# Patient Record
Sex: Female | Born: 1935 | Race: White | Hispanic: No
Health system: Southern US, Community
[De-identification: ages and names within clinical notes are randomized; demographics above are authoritative.]

---

## 2005-02-02 ENCOUNTER — Emergency Department (HOSPITAL_COMMUNITY): Admission: EM | Admit: 2005-02-02 | Discharge: 2005-02-03 | Payer: Self-pay | Admitting: Family Medicine

## 2006-01-13 ENCOUNTER — Ambulatory Visit (HOSPITAL_COMMUNITY): Admission: RE | Admit: 2006-01-13 | Discharge: 2006-01-13 | Payer: Self-pay | Admitting: Internal Medicine

## 2006-03-26 ENCOUNTER — Ambulatory Visit: Payer: Self-pay | Admitting: Internal Medicine

## 2007-10-05 ENCOUNTER — Ambulatory Visit (HOSPITAL_COMMUNITY): Admission: RE | Admit: 2007-10-05 | Discharge: 2007-10-05 | Payer: Self-pay | Admitting: Internal Medicine

## 2008-10-13 ENCOUNTER — Ambulatory Visit: Payer: Self-pay | Admitting: Internal Medicine

## 2008-10-17 ENCOUNTER — Ambulatory Visit (HOSPITAL_COMMUNITY): Admission: RE | Admit: 2008-10-17 | Discharge: 2008-10-17 | Payer: Self-pay | Admitting: Internal Medicine

## 2008-10-25 ENCOUNTER — Telehealth: Payer: Self-pay | Admitting: Internal Medicine

## 2008-10-27 ENCOUNTER — Encounter: Payer: Self-pay | Admitting: Internal Medicine

## 2008-10-27 ENCOUNTER — Ambulatory Visit: Payer: Self-pay | Admitting: Internal Medicine

## 2008-10-29 ENCOUNTER — Encounter: Payer: Self-pay | Admitting: Internal Medicine

## 2008-10-31 ENCOUNTER — Telehealth: Payer: Self-pay | Admitting: Internal Medicine

## 2009-11-13 ENCOUNTER — Ambulatory Visit (HOSPITAL_COMMUNITY): Admission: RE | Admit: 2009-11-13 | Discharge: 2009-11-13 | Payer: Self-pay | Admitting: Internal Medicine

## 2010-03-01 ENCOUNTER — Emergency Department (HOSPITAL_COMMUNITY): Admission: EM | Admit: 2010-03-01 | Discharge: 2010-03-01 | Payer: Self-pay | Admitting: Emergency Medicine

## 2010-04-12 ENCOUNTER — Encounter: Admission: RE | Admit: 2010-04-12 | Discharge: 2010-04-12 | Payer: Self-pay | Admitting: Internal Medicine

## 2010-05-02 ENCOUNTER — Encounter: Admission: RE | Admit: 2010-05-02 | Discharge: 2010-05-02 | Payer: Self-pay | Admitting: Internal Medicine

## 2010-10-10 ENCOUNTER — Encounter
Admission: RE | Admit: 2010-10-10 | Discharge: 2010-10-10 | Payer: Self-pay | Source: Home / Self Care | Attending: Internal Medicine | Admitting: Internal Medicine

## 2010-10-16 IMAGING — CT CT HEAD W/O CM
2 series · 16 of 30 positions shown, 20 images · non-contrast
Comparison: None.

CLINICAL DATA: Memory concerns.  No injury or history cancer.

CT HEAD WITHOUT CONTRAST
TECHNIQUE: Contiguous axial images were obtained from the base of
the skull through the vertex without contrast.

[Series 2: head w/o · axial · non-contrast · 0.43mm/px · z∈[+10,+139]mm · 13 of 28 slices shown, 17 images]
[im 2/28  brain]
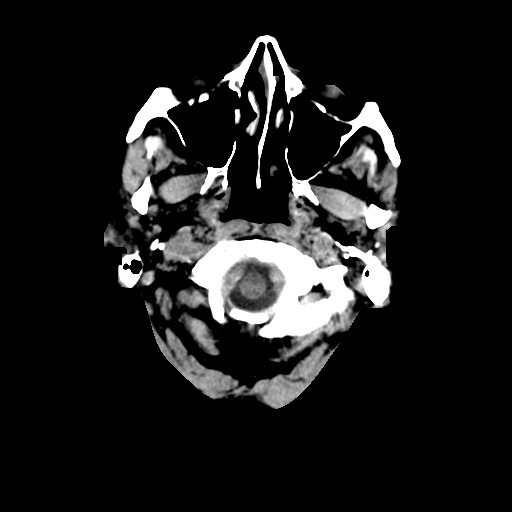
[im 2/28  bone]
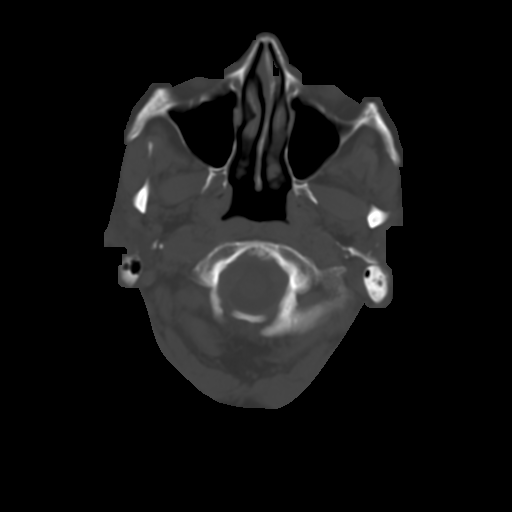
[im 4/28  brain]
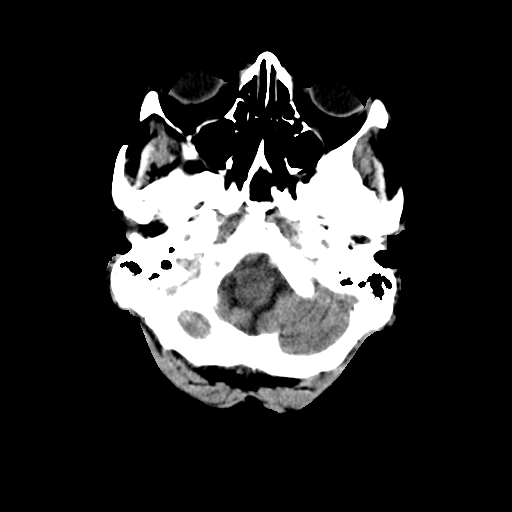
[im 6/28  brain]
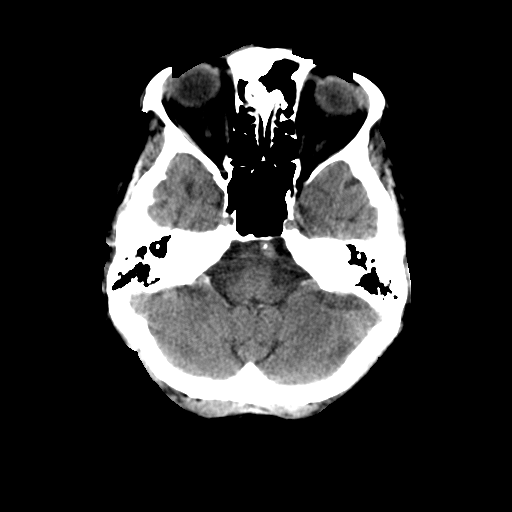
[im 8/28  brain]
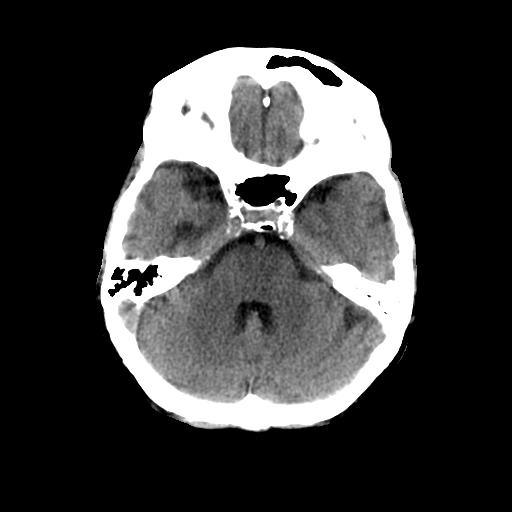
[im 10/28  brain]
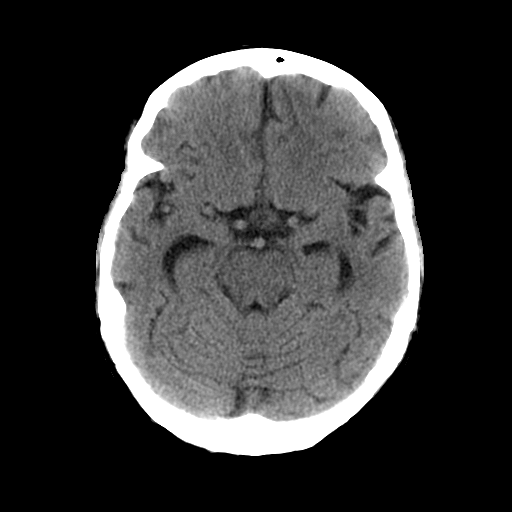
[im 10/28  bone]
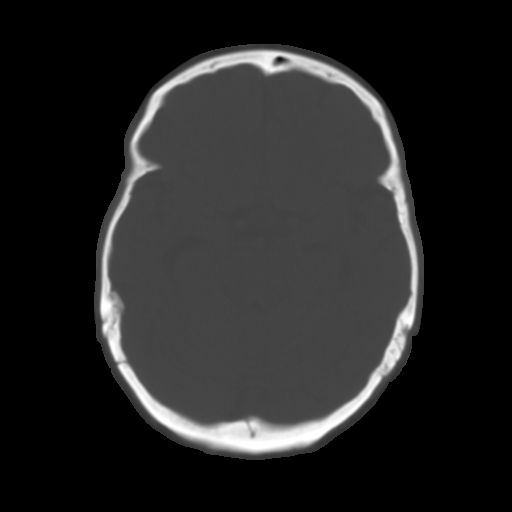
[im 12/28  brain]
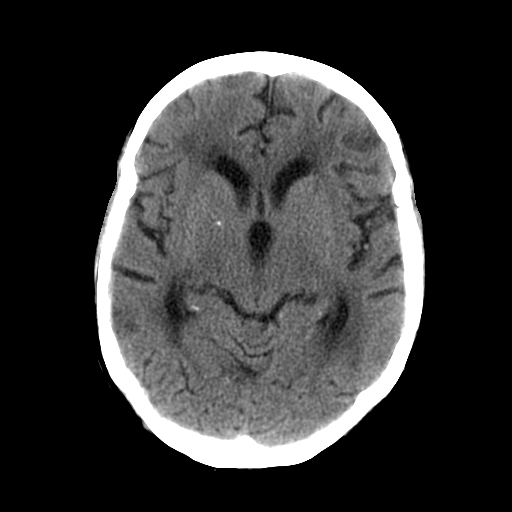
[im 14/28  brain]
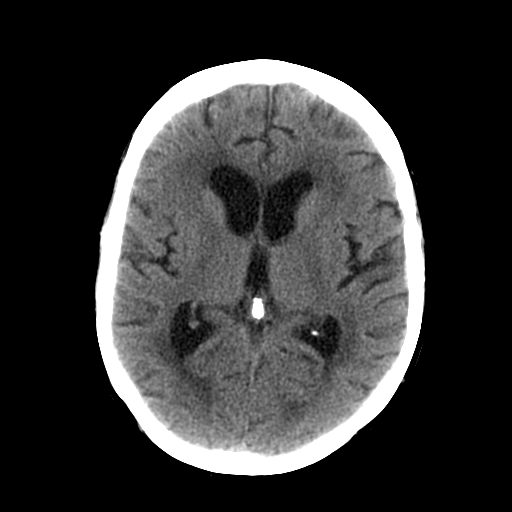
[im 16/28  brain]
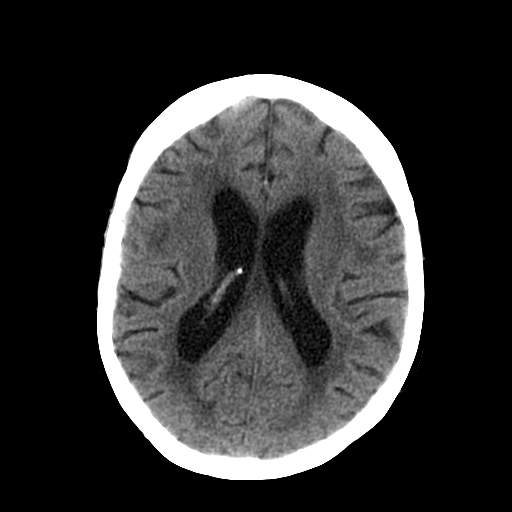
[im 18/28  brain]
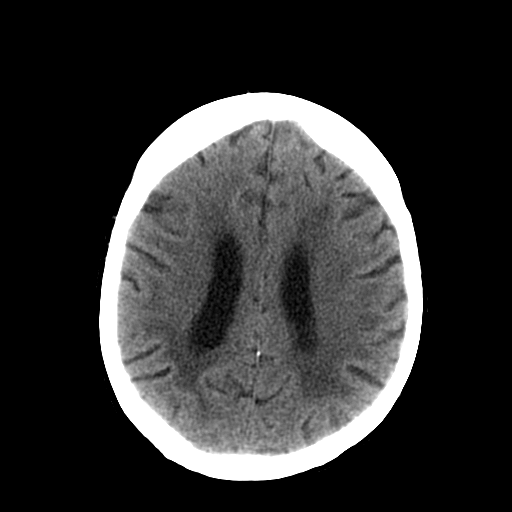
[im 18/28  bone]
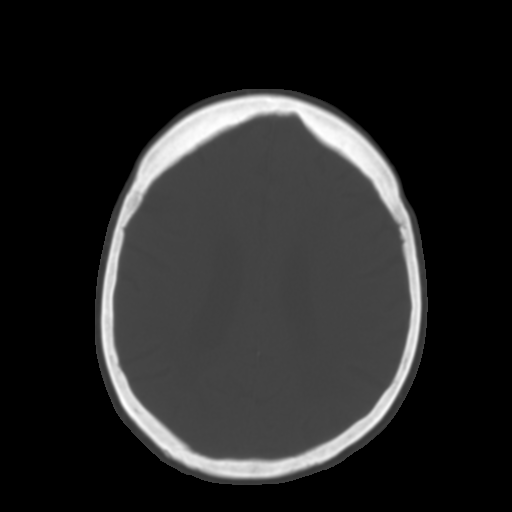
[im 20/28  brain]
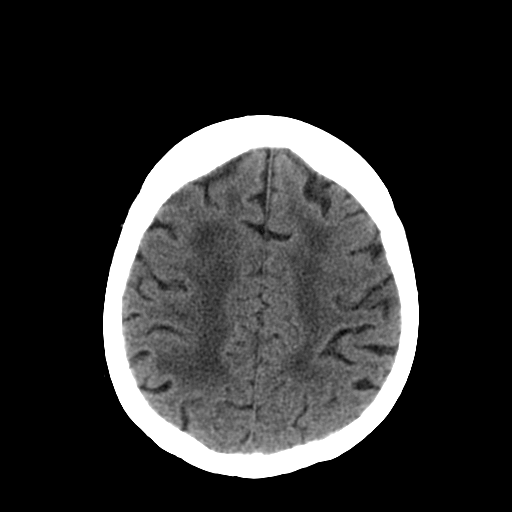
[im 22/28  brain]
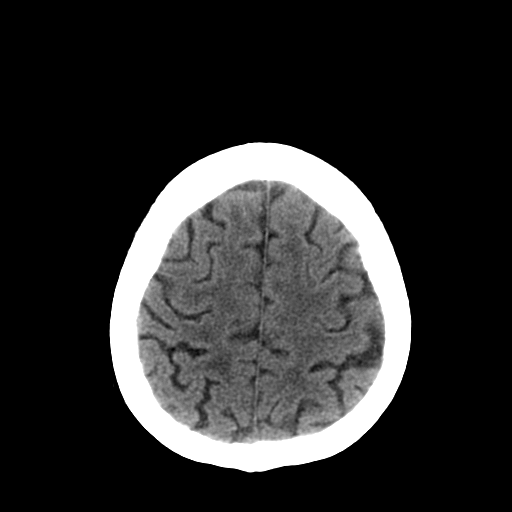
[im 24/28  brain]
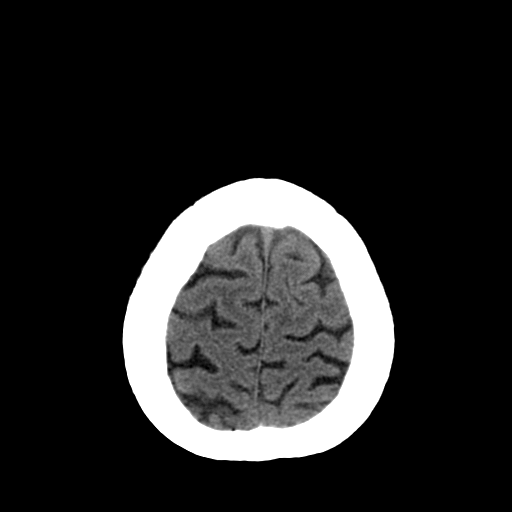
[im 26/28  brain]
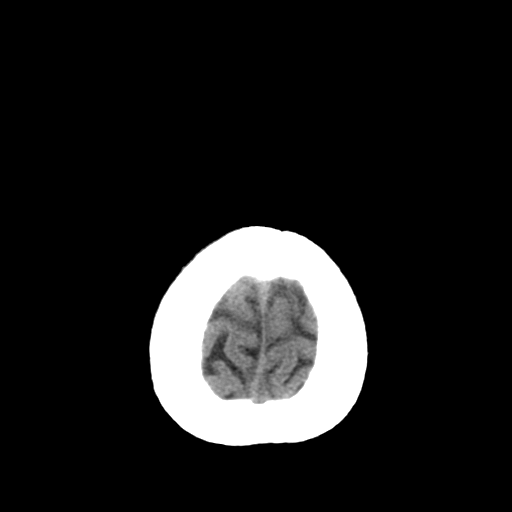
[im 26/28  bone]
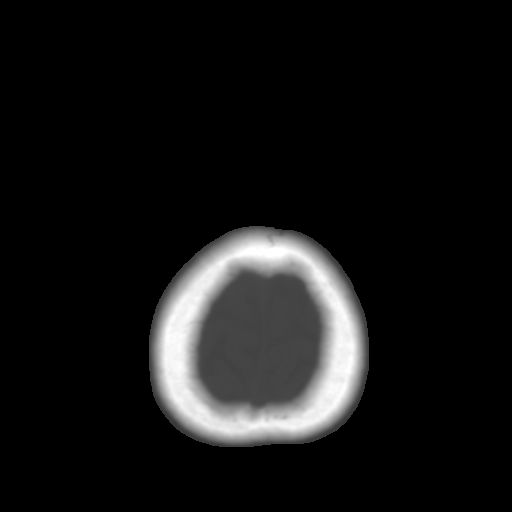

[Series 3: head bone · axial · 0.43mm/px · z∈[+10,+53]mm · 3 of 28 slices shown]
[im 2/28  bone]
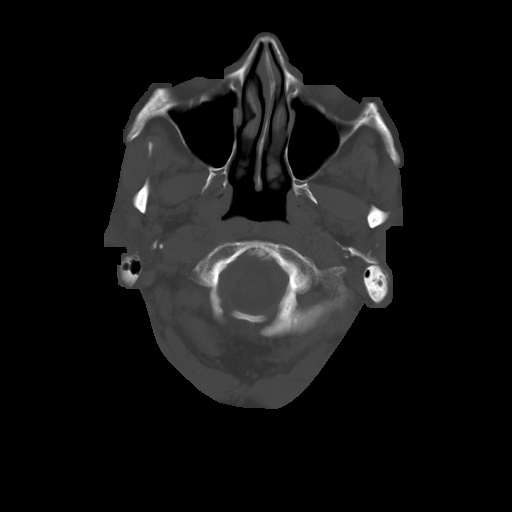
[im 6/28  bone]
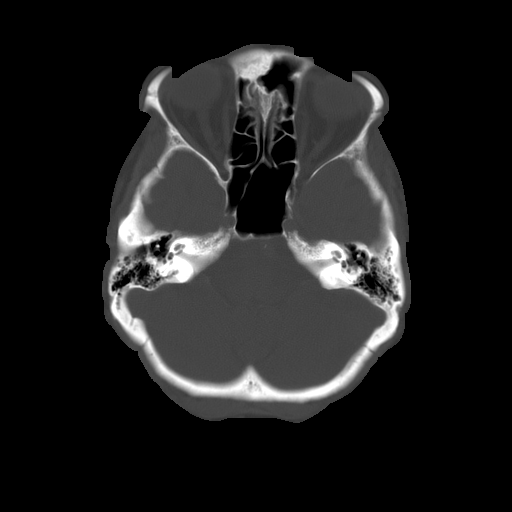
[im 10/28  bone]
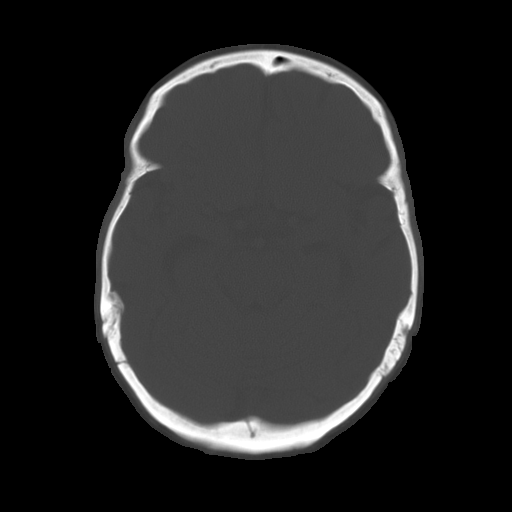

[16 of 30 positions shown; findings below may reference images not displayed]

FINDINGS: No intracranial hemorrhage. No intracranial mass
detected on this unenhanced exam.  Moderate nonspecific white
matter type changes probably related to result of small vessel
disease. No CT evidence of large acute infarct.  Small acute
infarct cannot be excluded by CT.  Intracranial vascular
calcifications.

Mild atrophy global in distribution within the range expected for a
patient of this age.
IMPRESSION: Moderate small vessel disease type changes.

## 2010-11-05 IMAGING — CR DG HIP COMPLETE 2+V*R*
2 series · 2 of 2 positions shown · non-contrast
Comparison: None.

CLINICAL DATA: History of previous injury in [place] from fall.  Hip
pain.

RIGHT HIP - COMPLETE 2+ VIEW

[view not recorded (1 of 2)]
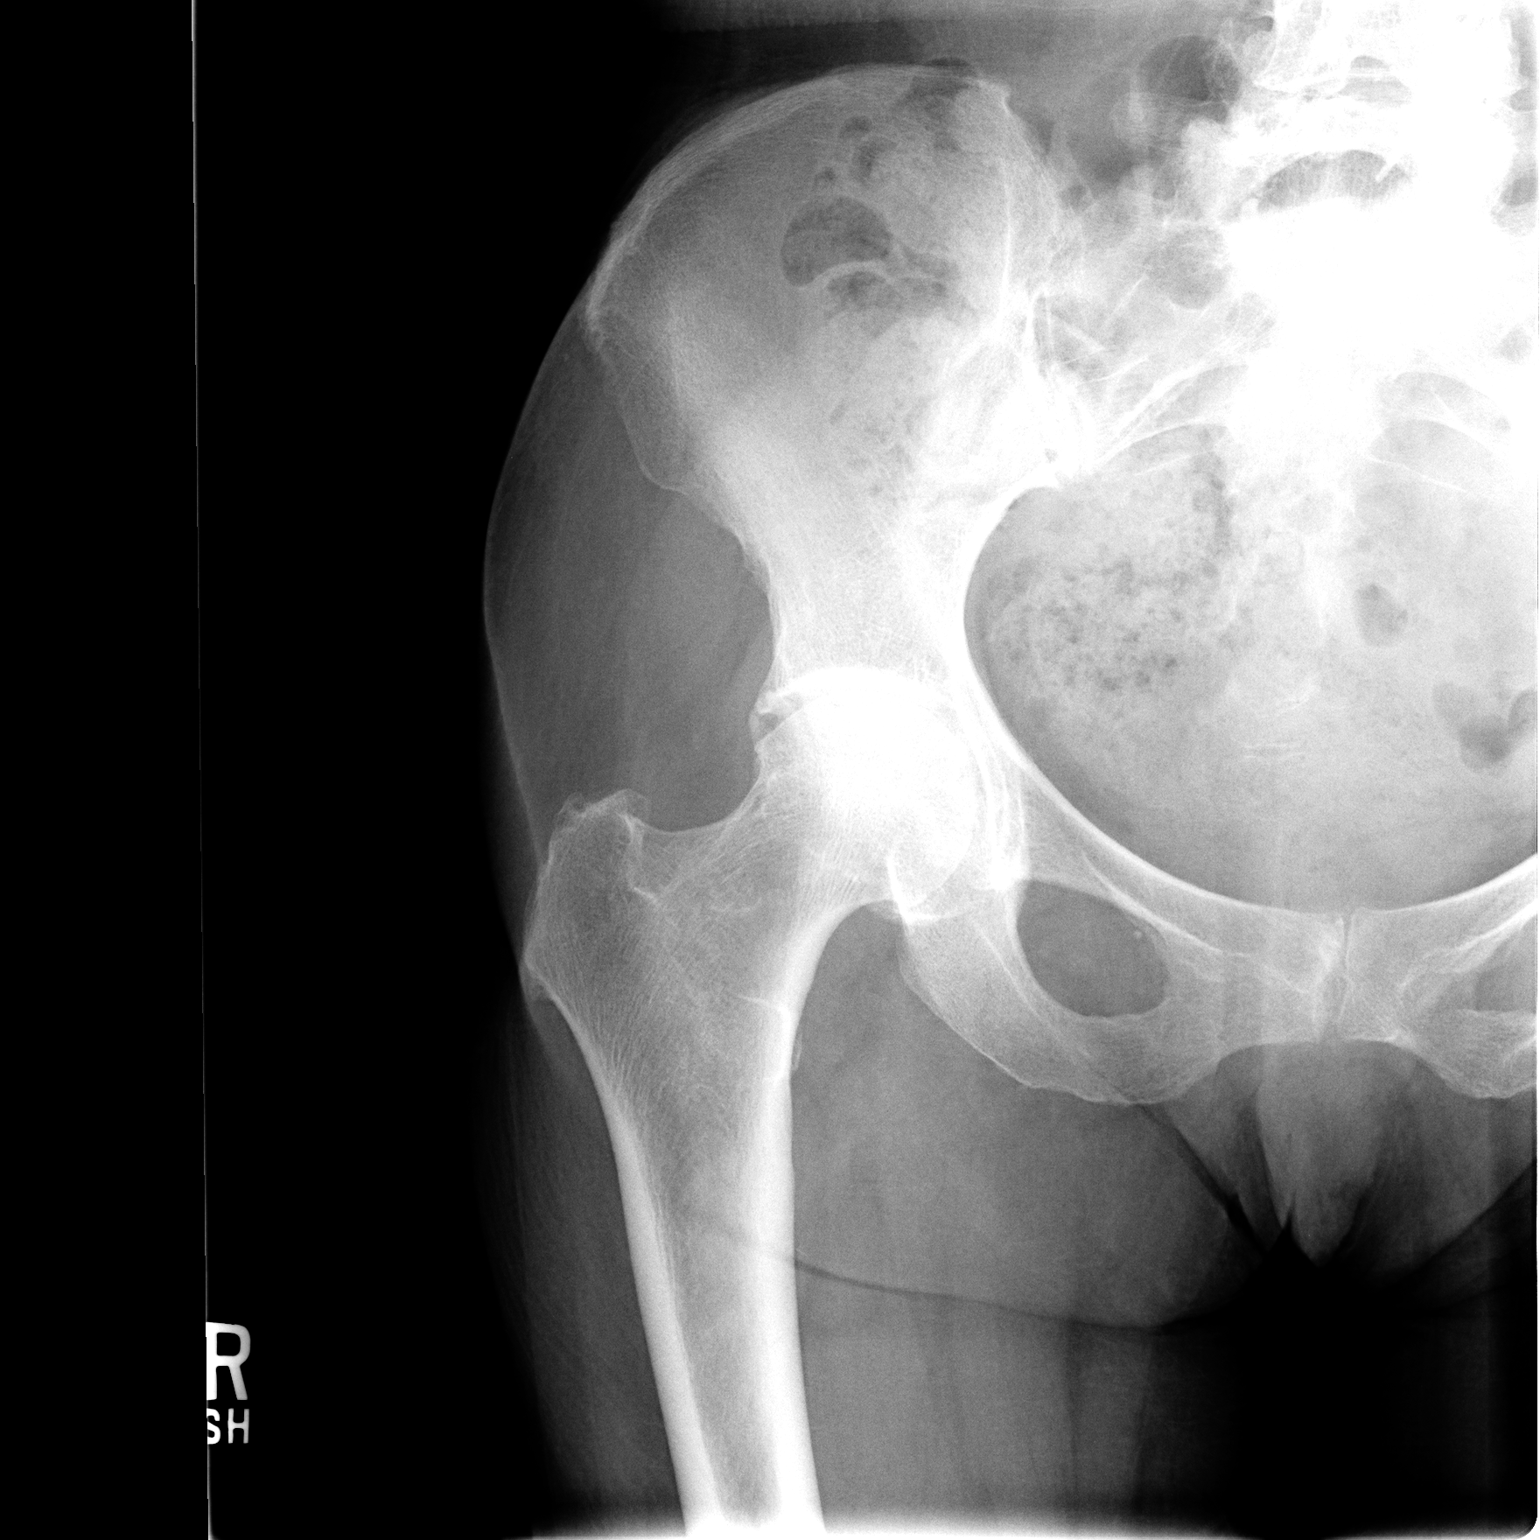

[view not recorded (2 of 2)]
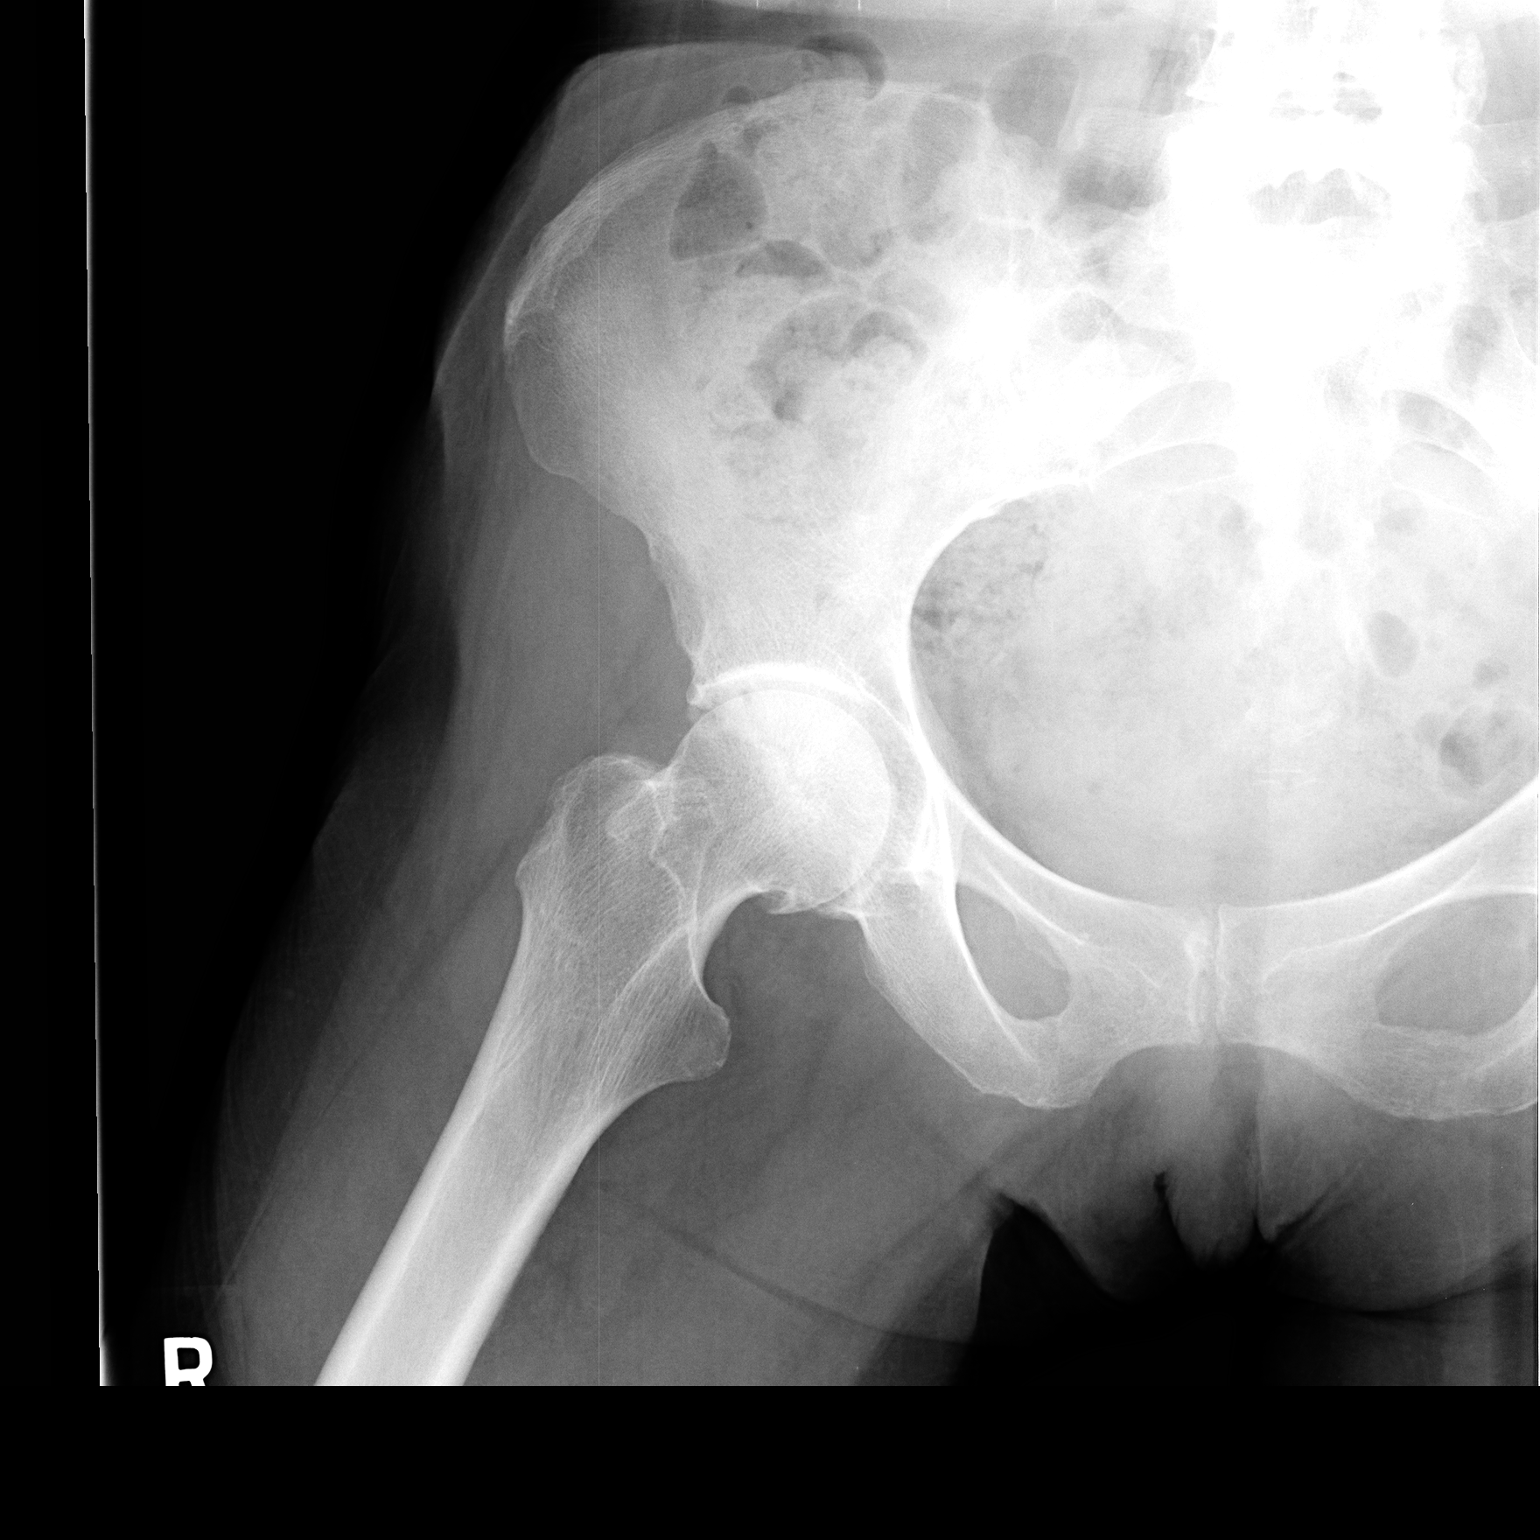

[2 of 2 positions shown; findings below may reference images not displayed]

FINDINGS: There is slight narrowing of the superior joint space.
There is marginal osteophyte formation.  No fracture or bony
destruction is seen.  No calcific bursitis is evident.
IMPRESSION: No fracture or dislocation.  Osteoarthritic changes.

## 2011-03-14 ENCOUNTER — Ambulatory Visit (HOSPITAL_COMMUNITY): Payer: Medicare Other | Attending: Internal Medicine

## 2011-03-14 DIAGNOSIS — M810 Age-related osteoporosis without current pathological fracture: Secondary | ICD-10-CM | POA: Insufficient documentation

## 2012-12-23 ENCOUNTER — Other Ambulatory Visit: Payer: Self-pay | Admitting: *Deleted

## 2012-12-23 ENCOUNTER — Other Ambulatory Visit: Payer: Medicare Other

## 2012-12-23 DIAGNOSIS — N390 Urinary tract infection, site not specified: Secondary | ICD-10-CM

## 2012-12-25 ENCOUNTER — Other Ambulatory Visit: Payer: Self-pay | Admitting: *Deleted

## 2012-12-25 DIAGNOSIS — E039 Hypothyroidism, unspecified: Secondary | ICD-10-CM

## 2012-12-28 ENCOUNTER — Other Ambulatory Visit: Payer: Self-pay | Admitting: Geriatric Medicine

## 2012-12-28 MED ORDER — SULFAMETHOXAZOLE-TRIMETHOPRIM 800-160 MG PO TABS: ORAL_TABLET | ORAL | Status: AC

## 2013-01-12 ENCOUNTER — Encounter: Payer: Self-pay | Admitting: Internal Medicine

## 2013-02-17 ENCOUNTER — Ambulatory Visit: Payer: Self-pay | Admitting: Internal Medicine

## 2013-09-07 ENCOUNTER — Encounter: Payer: Self-pay | Admitting: Internal Medicine

## 2014-01-24 ENCOUNTER — Encounter (HOSPITAL_BASED_OUTPATIENT_CLINIC_OR_DEPARTMENT_OTHER): Payer: Medicare Other | Attending: General Surgery

## 2014-01-24 DIAGNOSIS — G309 Alzheimer's disease, unspecified: Secondary | ICD-10-CM | POA: Insufficient documentation

## 2014-01-24 DIAGNOSIS — H409 Unspecified glaucoma: Secondary | ICD-10-CM | POA: Insufficient documentation

## 2014-01-24 DIAGNOSIS — Z79899 Other long term (current) drug therapy: Secondary | ICD-10-CM | POA: Insufficient documentation

## 2014-01-24 DIAGNOSIS — F0280 Dementia in other diseases classified elsewhere without behavioral disturbance: Secondary | ICD-10-CM | POA: Diagnosis not present

## 2014-01-24 DIAGNOSIS — N189 Chronic kidney disease, unspecified: Secondary | ICD-10-CM | POA: Insufficient documentation

## 2014-01-24 DIAGNOSIS — L98499 Non-pressure chronic ulcer of skin of other sites with unspecified severity: Secondary | ICD-10-CM | POA: Diagnosis not present

## 2014-01-24 DIAGNOSIS — R4701 Aphasia: Secondary | ICD-10-CM | POA: Diagnosis not present

## 2014-01-24 DIAGNOSIS — Z7982 Long term (current) use of aspirin: Secondary | ICD-10-CM | POA: Insufficient documentation

## 2014-01-25 NOTE — Progress Notes (Signed)
[facility]  NAME:  Crystal Kim, Diamonds                   ACCOUNT NO.:  [id number]  MEDICAL RECORD NO.:  [id number]      DATE OF BIRTH:  03-10-1930  PHYSICIAN:  Wayland Denislaire Sanger, DO       VISIT DATE:  01/24/2014                                  OFFICE VISIT   The patient is an 80 year old female who is here for evaluation of a right trochanter ulcer.  She has Alzheimer's, and at this point, showing to be aphasic.  She is accompanied by her 2 daughters who are well aware of her medical condition.  She is in a nursing facility and get home health visits as well.  She is less mobile than she was and it is likely that this is why she got the pressure ulcer.  PAST MEDICAL HISTORY:  Positive for weight loss, glaucoma, cardiac disease, chronic renal disease, vitamin D deficiency, dementia, mitral valve prolapse, and thyroid disease.  FAMILY HISTORY:  At this point is noncontributory.  SOCIAL HISTORY:  Lives in a facility, has home care, 4 children, strong involvement of the family.  No alcohol, no tobacco.  ALLERGIES:  No known drug allergies.  MEDICATIONS:  Include calcium, bacitracin, vitamin, Synthroid, EpiPen, Ensure, folic acid, Dilantin, and aspirin.  REVIEW OF SYSTEMS:  Given by the family, and no ongoing issues at present.  PHYSICAL EXAMINATION:  She is alert, does not really appear to be oriented, but pleasant.  Her pupils are equal.  No cervical lymphadenopathy.  No breathing difficulty.  Her abdomen is soft and nontender.  Her pulses are regular.  Upper and lower extremity pulses are strong.  She does not have any swelling noted in her lower extremities.  The wound is on the right trochanteric area and was debrided of the eschar down to fat.  It does not appear to be infected. The details are noted in the chart.  RECOMMENDATIONS:  Recommendation is for multivitamin, vitamin C, zinc, Ensure, Dial soap cleaning, check a pre-albumin offloading, and Santyl, and  see her back in 3 to 4 weeks.     Wayland Denislaire Sanger, DO    CS/MEDQ  D:  01/24/2014  T:  01/25/2014  Job:  [id number]

## 2014-02-21 ENCOUNTER — Encounter (HOSPITAL_BASED_OUTPATIENT_CLINIC_OR_DEPARTMENT_OTHER): Attending: Plastic Surgery

## 2014-02-21 DIAGNOSIS — L8990 Pressure ulcer of unspecified site, unspecified stage: Secondary | ICD-10-CM | POA: Insufficient documentation

## 2014-02-21 DIAGNOSIS — L89209 Pressure ulcer of unspecified hip, unspecified stage: Secondary | ICD-10-CM | POA: Diagnosis not present

## 2014-03-05 ENCOUNTER — Encounter: Payer: Self-pay | Admitting: Internal Medicine

## 2014-05-23 ENCOUNTER — Encounter (HOSPITAL_BASED_OUTPATIENT_CLINIC_OR_DEPARTMENT_OTHER): Payer: Medicare Other | Attending: Plastic Surgery

## 2015-01-06 DEATH — deceased

## 2015-04-14 ENCOUNTER — Encounter: Payer: Self-pay | Admitting: Internal Medicine
